# Patient Record
Sex: Female | Born: 1980 | Race: White | Hispanic: No | Marital: Married | Smoking: Never smoker
Health system: Southern US, Community
[De-identification: ages and names within clinical notes are randomized; demographics above are authoritative.]

## PROBLEM LIST (undated history)

## (undated) DIAGNOSIS — G47419 Narcolepsy without cataplexy: Secondary | ICD-10-CM

## (undated) DIAGNOSIS — F909 Attention-deficit hyperactivity disorder, unspecified type: Secondary | ICD-10-CM

## (undated) DIAGNOSIS — Q2112 Patent foramen ovale: Secondary | ICD-10-CM

## (undated) DIAGNOSIS — R519 Headache, unspecified: Secondary | ICD-10-CM

## (undated) DIAGNOSIS — R51 Headache: Secondary | ICD-10-CM

## (undated) DIAGNOSIS — Q211 Atrial septal defect: Secondary | ICD-10-CM

---

## 2014-04-11 LAB — US OB DETAIL + 14 WK

## 2014-04-12 ENCOUNTER — Other Ambulatory Visit (HOSPITAL_COMMUNITY): Payer: Self-pay | Admitting: *Deleted

## 2014-04-12 DIAGNOSIS — Q249 Congenital malformation of heart, unspecified: Secondary | ICD-10-CM

## 2014-04-12 DIAGNOSIS — IMO0002 Reserved for concepts with insufficient information to code with codable children: Secondary | ICD-10-CM

## 2014-04-12 DIAGNOSIS — Z0489 Encounter for examination and observation for other specified reasons: Secondary | ICD-10-CM

## 2014-04-21 ENCOUNTER — Encounter (HOSPITAL_COMMUNITY): Payer: Self-pay

## 2014-04-21 ENCOUNTER — Ambulatory Visit (HOSPITAL_COMMUNITY)
Admission: RE | Admit: 2014-04-21 | Discharge: 2014-04-21 | Disposition: A | Discharge status: Home / Self Care | Payer: BLUE CROSS/BLUE SHIELD | Source: Ambulatory Visit | Attending: *Deleted | Admitting: *Deleted

## 2014-04-21 DIAGNOSIS — O283 Abnormal ultrasonic finding on antenatal screening of mother: Secondary | ICD-10-CM

## 2014-04-21 DIAGNOSIS — Z315 Encounter for genetic counseling: Secondary | ICD-10-CM | POA: Diagnosis present

## 2014-04-21 DIAGNOSIS — Z3A20 20 weeks gestation of pregnancy: Secondary | ICD-10-CM | POA: Insufficient documentation

## 2014-04-21 DIAGNOSIS — Z8279 Family history of other congenital malformations, deformations and chromosomal abnormalities: Secondary | ICD-10-CM | POA: Insufficient documentation

## 2014-04-21 DIAGNOSIS — Q248 Other specified congenital malformations of heart: Secondary | ICD-10-CM | POA: Diagnosis not present

## 2014-04-21 DIAGNOSIS — Q330 Congenital cystic lung: Secondary | ICD-10-CM

## 2014-04-21 DIAGNOSIS — Z0489 Encounter for examination and observation for other specified reasons: Secondary | ICD-10-CM

## 2014-04-21 DIAGNOSIS — O358XX0 Maternal care for other (suspected) fetal abnormality and damage, not applicable or unspecified: Secondary | ICD-10-CM | POA: Insufficient documentation

## 2014-04-21 DIAGNOSIS — Q249 Congenital malformation of heart, unspecified: Secondary | ICD-10-CM

## 2014-04-21 DIAGNOSIS — Z3689 Encounter for other specified antenatal screening: Secondary | ICD-10-CM | POA: Insufficient documentation

## 2014-04-21 DIAGNOSIS — IMO0002 Reserved for concepts with insufficient information to code with codable children: Secondary | ICD-10-CM

## 2014-04-21 HISTORY — DX: Narcolepsy without cataplexy: G47.419

## 2014-04-21 HISTORY — DX: Headache: R51

## 2014-04-21 HISTORY — DX: Headache, unspecified: R51.9

## 2014-04-21 HISTORY — DX: Atrial septal defect: Q21.1

## 2014-04-21 HISTORY — DX: Patent foramen ovale: Q21.12

## 2014-04-21 HISTORY — DX: Attention-deficit hyperactivity disorder, unspecified type: F90.9

## 2014-04-21 NOTE — Consult Note (Signed)
Durwin NoraJeffrey M Jahmere Bramel, MD Physician Signed Maternal-Fetal Medicine Progress Notes 04/21/2014 3:13 PM    Expand All Collapse All   MFM Consultation:  CPAM is believed to result from focal arrest in fetal lung development before the seventh week of gestation secondary to a variety of pulmonary insults. Depending on the time and type of insult, 4-26% of cases can be associated with other congenital abnormalities. However, arrest of pulmonary development with distortion of architectural differentiation may take place at any stage of embryonic development. CAM differs from normal lung tissue because of a combination of increased cell proliferation and decreased apoptosis. A well-defined intrapulmonary bronchial system is lacking, and normally formed bronchi supplying the mass are absent.   CPAM is subdivided into 3 major types.   Type I lesions, the most common, are composed of 1 or more cysts measuring 2-10 cm in diameter. Larger cysts are often accompanied by smaller cysts, and their walls contain muscle, elastic, or fibrous tissue. Cysts are frequently lined by pseudostratified columnar epithelial cells, which occasionally produce mucin. Mucinogenic differentiation is unique to this subtype of CAM.   Type II lesions are characterized by small relatively uniform cysts resembling bronchioles. These cysts are lined by cuboid-to-columnar epithelium and have a thin fibromuscular wall. The cysts generally measure 0.5-2 cm in diameter.   Type III lesions consist of microscopic, adenomatoid cysts, and are grossly a solid mass without obvious cyst formation. Microscopic adenomatoid cysts are present.   CPAM receives its blood supply from the pulmonary circulation and is not sequestered from the tracheobronchial tree. However, type II and III lesions can occasionally coexist with extralobar sequestration, and in such cases, they may receive systemic arterial supply. CAM may also occur in combination with a  polyalveolar lobe. A polyalveolar lobe is a form of congenital emphysema with increased number of alveoli with normal bronchi and pulmonary vasculature. CAM usually occurs early in fetal life, whereas polyalveolar lobe occurs late.   Differential diagnosis: CPAM is differentiated from other congenital cystic disease by 5 characteristics including the following: (1) absence of bronchial cartilage (unless it is trapped within the lesion); (2) absence of bronchial tubular glands; (3) presence of tall columnar mucinous epithelium; (4) overproduction of terminal bronchiolar structures without alveolar differentiation, except in the subpleural areas; and (5) massive enlargement of the affected lobe that displaces other thoracic structures.  Mortality/Morbidity:  The prognosis primarily depends on the size of the lesion. In a [place] series of 48 patients, the incidence of postnatal demise was 10% (10 of 40 patients) with 8 spontaneous and voluntary abortions. Larger lesions have a higher incidence of mediastinal shift, vascular compromise, polyhydramnios, pulmonary hypoplasia, and hydrops, which may lead to intrauterine fetal demise or neonatal death.  Type III CAM tends to be extensive and therefore tends to have a poor prognosis. The prognosis is also poor with bilateral lung involvement, prematurity, and severe associated malformations. The most commonly associated anomalies occur in the type II form. The anomalies affect the renal (cystic disease, agenesis, dysgenesis), intestinal (atresias), cardiac, and osseous systems.  When CAM is identified in utero, as many as 56% of the lesions detected can regress spontaneously, although initially, they may progress. As the lesion decreases in size, mediastinal shift is corrected. Persistent lesions may only be discovered later in life, and some may be asymptomatic. Eventual removal of even asymptomatic masses is recommended because of potential risk of secondary  infection, hemorrhage, and reports of carcinomas arising in CAM.  In fetuses with life-threatening lesions such as  the development of hydrops, the anticipated mortality rate is nearly 100%.  Race: No clear racial predilection for CAM exists.  Sex: Sex-related incidences are equal for CAM.  Age: Most cases of CAM are diagnosed in the patient's first 6 months of life, with 70% of patients presenting in the first month of life. As many as 90% of cases are reported within the patient's first 2 years of life. Occasionally, CAM is discovered later in life, usually as a result of chronic or recurrent pulmonary infection. When CAM was diagnosed prenatally in one study, the mean gestational age at diagnosis was 22.6 weeks +/- 3.3.   Anatomy:  Communication with the tracheobronchial tree usually is retained, and the vascular supply and venous drainage are to the pulmonary circulation, unless CAM is associated with sequestration, as discussed above. Lesions occur with equal frequency in either lung, but the lesions have a slight predilection for the upper lobes. Lobar involvement is seen most often, but multiple lobes, the entire lung, or segments of both lungs may be involved.  Complications:  Fetal death caused by hydrops, fetal surgery, prematurity, or associated malformations. Premature delivery due to polyhydramnios. Respiratory distress due to hydrops, pulmonary hypoplasia, pulmonary hypertension, pneumothorax, or prematurity. Postnatal death due to respiratory distress, untreated hydrops, or pulmonary hypertension. Recurrent pneumonia, pneumothorax, and hemothorax Malignant change: Rhabdomyosarcoma, pulmonary blastomas, minute squamous cell carcinoma, and bronchioloalveolar carcinoma have all been described in association with CCAM. Prognosis: The risk of mortality in fetuses with hydrops is as high as 80-90%. Other indicators of poor prognosis include the type of lesion, with microcystic CCAM  associated with much poorer outcomes. The overall size of the lesion has also been reported as being an important predictor of survival; however, this index may be compromised by the fact that CCAM may undergo involution and even disappear in utero. Some authorities have suggested that the presence of bilateral lesions is associated with a worse outcome. More controversially, left-sided lesions may be associated with a greater mortality rate than right-sided lesions. One study suggested that polyhydramnios is also associated with a poorer outcome.  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 see genetic counseling (separate) 2. interval growth ultrasounds monthly (scheduled) 3. q2 week limited scans for hydrops (scheduled) 4. see AS OBGYN (separate) 5. fetal echo scheduled 6. consultation with pediatric CT surgery around 28 weeks 7. delivery 39-40 weeks at either [facility] or [facility].  Time Spent: I spent in excess of 60 minutes in consultation with this patient to review records, evaluate her case, and provide her with an adequate discussion and education. More than 50% of this time was spent in direct face-to-face counseling. It was a pleasure seeing your patient in the office today. Thank you for consultation. Please do not hesitate to contact our service for any further questions.   Thank you,  Louann Sjogren Gaynelle Arabian, Louann Sjogren, MD, MS, FACOG Assistant Professor Section of Maternal-Fetal Medicine [facility]       Routing History     Date/Time From To Method   04/21/2014 3:44 PM Durwin Nora, MD Belva Crome, MD Fax

## 2014-04-21 NOTE — Progress Notes (Signed)
Genetic Counseling  High-Risk Gestation Note  Appointment Date:  04/21/2014 Referred By: Myles Gip, MD Date of Birth:  Jun 19, 1980 Partner:  Izell Branson   Pregnancy History: [id number] Estimated Date of Delivery: 09/05/14 Estimated Gestational Age: 8w3dAttending: JViann Fish MD   I met with Mrs. Kayleanna Min and her husband, Mr. BChar Feltman for genetic counseling because of the patient's history of congenital heart disease and the ultrasound finding of CPAM.  We began by reviewing the ultrasound in detail. 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 Complete ultrasound results reported separately.   Congenital pulmonary airway malformation (CPAM) is a developmental anomaly of the lower respiratory tract. We reviewed that the prognosis can depend upon the size of the lesion and the development of hydrops. In the absence of hydrops, we discussed that the prognosis is overall very good. We discussed that CPAM typically occurs sporadically and is not expected to be associated with an increased risk for underlying fetal aneuploidy or genetic conditions. Please see separate MFM consultation note for more detailed discussion of CPAM with the patient. Follow-up ultrasounds were scheduled for 05/04/14 and 05/17/14.   Both family histories were reviewed and found to be contributory for history of patent foramen ovale (PFO) for the patient. She reported that this was diagnosed at age [age]years old when she was having symptoms that were later determined to be caused by anxiety. She has not required surgical treatment for the PFO and does not see cardiology regularly. Mrs. Basara reported that her last echocardiogram was approximately 89years age. She reported that her cardiologist at that time had recommended she be seen for follow-up in approximately 4-5 years. Congenital heart defects occur in approximately 1% of pregnancies.  Congenital  heart defects may occur due to multifactorial influences, chromosomal abnormalities, genetic syndromes or environmental exposures.  Isolated heart defects are generally multifactorial.  Given the reported family history and assuming multifactorial inheritance, the risk for a congenital heart defect in the current pregnancy would be approximately 5-6%. We discussed the option of fetal echocardiogram in the pregnancy to assess fetal heart structure in more detail. [facility] The patient understands that ultrasound in pregnancy cannot diagnose or rule out all birth defects and specifically would not be able to rule out a PFO.   The father of the pregnancy reported a maternal aunt who had a son who was stillborn. No underlying cause was known by the couple at the time of today's visit for the stillbirth. This same aunt reportedly also has three healthy children. We discussed that there can be many causes for stillbirth and that in some cases, an underlying cause is not determined. We discussed that this reported family history is unlikely to have implications for relatives. However, without further information regarding the provided family history, an accurate genetic risk cannot be calculated. Further genetic counseling is warranted if more information is obtained.  Mrs. Mcafee was provided with written information regarding cystic fibrosis (CF) including the carrier frequency and incidence in the Caucasian population, the availability of carrier testing and prenatal diagnosis if indicated.  In addition, we discussed that CF is routinely screened for as part of the [place] newborn screening panel.  CF carrier screening was performed during her first pregnancy and was reportedly normal for the mutations screened. Thus, her risk to be a CF carrier has been reduced but not eliminated.    Mrs. Axtman denied exposure to environmental toxins or  chemical  agents. She denied the use of alcohol, tobacco or street drugs. She denied significant viral illnesses during the course of her pregnancy. Her medical and surgical histories were additionally contributory for taking ritalin in the pregnancy. She reported previously taking ibuprofen but discontinued at approximately 15-[redacted] weeks gestation.  Dr. Margurite Auerbach reviewed the history of Mrs. Steinhoff's patent ductus arteriosus and recommended that Mrs. Hooley schedule a maternal echocardiogram during the pregnancy given this history and given that her most recent echocardiogram was six year ago. Mrs. Malburg can follow-up with her cardiologist locally for this exam, if preferred.   I counseled this couple regarding the above risks and available options.  The approximate face-to-face time with the genetic counselor was 35 minutes.  Chipper Oman, MS Certified Genetic Counselor 04/21/2014

## 2014-04-21 NOTE — Progress Notes (Signed)
MFM Consultation:  CPAM is believed to result from focal arrest in fetal lung development before the seventh week of gestation secondary to a variety of pulmonary insults. Depending on the time and type of insult, 4-26% of cases can be associated with other congenital abnormalities. However, arrest of pulmonary development with distortion of architectural differentiation may take place at any stage of embryonic development.  CAM differs from normal lung tissue because of a combination of increased cell proliferation and decreased apoptosis. A well-defined intrapulmonary bronchial system is lacking, and normally formed bronchi supplying the mass are absent.   CPAM is subdivided into 3 major types.   Type I lesions, the most common, are composed of 1 or more cysts measuring 2-10 cm in diameter. Larger cysts are often accompanied by smaller cysts, and their walls contain muscle, elastic, or fibrous tissue. Cysts are frequently lined by pseudostratified columnar epithelial cells, which occasionally produce mucin. Mucinogenic differentiation is unique to this subtype of CAM.   Type II lesions are characterized by small relatively uniform cysts resembling bronchioles. These cysts are lined by cuboid-to-columnar epithelium and have a thin fibromuscular wall. The cysts generally measure 0.5-2 cm in diameter.   Type III lesions consist of microscopic, adenomatoid cysts, and are grossly a solid mass without obvious cyst formation. Microscopic adenomatoid cysts are present.   CPAM receives its blood supply from the pulmonary circulation and is not sequestered from the tracheobronchial tree. However, type II and III lesions can occasionally coexist with extralobar sequestration, and in such cases, they may receive systemic arterial supply. CAM may also occur in combination with a polyalveolar lobe. A polyalveolar lobe is a form of congenital emphysema with increased number of alveoli with normal bronchi and  pulmonary vasculature. CAM usually occurs early in fetal life, whereas polyalveolar lobe occurs late.   Differential diagnosis: CPAM is differentiated from other congenital cystic disease by 5 characteristics including the following: (1) absence of bronchial cartilage (unless it is trapped within the lesion); (2) absence of bronchial tubular glands; (3) presence of tall columnar mucinous epithelium; (4) overproduction of terminal bronchiolar structures without alveolar differentiation, except in the subpleural areas; and (5) massive enlargement of the affected lobe that displaces other thoracic structures.  Mortality/Morbidity:  The prognosis primarily depends on the size of the lesion. In a [place] series of 48 patients, the incidence of postnatal demise was 10% (10 of 40 patients) with 8 spontaneous and voluntary abortions. Larger lesions have a higher incidence of mediastinal shift, vascular compromise, polyhydramnios, pulmonary hypoplasia, and hydrops, which may lead to intrauterine fetal demise or neonatal death.  Type III CAM tends to be extensive and therefore tends to have a poor prognosis. The prognosis is also poor with bilateral lung involvement, prematurity, and severe associated malformations. The most commonly associated anomalies occur in the type II form. The anomalies affect the renal (cystic disease, agenesis, dysgenesis), intestinal (atresias), cardiac, and osseous systems.  When CAM is identified in utero, as many as 56% of the lesions detected can regress spontaneously, although initially, they may progress.  As the lesion decreases in size, mediastinal shift is corrected. Persistent lesions may only be discovered later in life, and some may be asymptomatic.  Eventual removal of even asymptomatic masses is recommended because of potential risk of secondary infection, hemorrhage, and reports of carcinomas arising in CAM.  In fetuses with life-threatening lesions such as the development  of hydrops, the anticipated mortality rate is nearly 100%.  Race: No clear racial predilection for CAM  exists.  Sex: Sex-related incidences are equal for CAM.  Age: Most cases of CAM are diagnosed in the patient's first 6 months of life, with 70% of patients presenting in the first month of life. As many as 90% of cases are reported within the patient's first 2 years of life. Occasionally, CAM is discovered later in life, usually as a result of chronic or recurrent pulmonary infection. When CAM was diagnosed prenatally in one study, the mean gestational age at diagnosis was 22.6 weeks +/- 3.3.   Anatomy:  Communication with the tracheobronchial tree usually is retained, and the vascular supply and venous drainage are to the pulmonary circulation, unless CAM is associated with sequestration, as discussed above. Lesions occur with equal frequency in either lung, but the lesions have a slight predilection for the upper lobes. Lobar involvement is seen most often, but multiple lobes, the entire lung, or segments of both lungs may be involved.  Complications:  Fetal death caused by hydrops, fetal surgery, prematurity, or associated malformations.  Premature delivery due to polyhydramnios.  Respiratory distress due to hydrops, pulmonary hypoplasia, pulmonary hypertension, pneumothorax, or prematurity.  Postnatal death due to respiratory distress, untreated hydrops, or pulmonary hypertension.  Recurrent pneumonia, pneumothorax, and hemothorax Malignant change: Rhabdomyosarcoma, pulmonary blastomas, minute squamous cell carcinoma, and bronchioloalveolar carcinoma have all been described in association with CCAM. Prognosis: The risk of mortality in fetuses with hydrops is as high as 80-90%. Other indicators of poor prognosis include the type of lesion, with microcystic CCAM associated with much poorer outcomes.  The overall size of the lesion has also been reported as being an important predictor of survival;  however, this index may be compromised by the fact that CCAM may undergo involution and even disappear in utero.  Some authorities have suggested that the presence of bilateral lesions is associated with a worse outcome.  More controversially, left-sided lesions may be associated with a greater mortality rate than right-sided lesions.  One study suggested that polyhydramnios is also associated with a poorer outcome.  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 see genetic counseling (separate) 2. interval growth ultrasounds monthly (scheduled) 3. q2 week limited scans for hydrops (scheduled) 4. see AS OBGYN (separate) 5. fetal echo scheduled 6. consultation with pediatric CT surgery around 28 weeks 7. delivery 39-40 weeks at either [facility] or [facility].  Time Spent: I spent in excess of 60 minutes in consultation with this patient to review records, evaluate her case, and provide her with an adequate discussion and education.  More than 50% of this time was spent in direct face-to-face counseling. It was a pleasure seeing your patient in the office today.  Thank you for consultation. Please do not hesitate to contact our service for any further questions.   Thank you,  Louann Sjogren Gaynelle Arabian, Louann Sjogren, MD, MS, FACOG Assistant Professor Section of Maternal-Fetal Medicine [facility]

## 2014-04-24 ENCOUNTER — Encounter (HOSPITAL_COMMUNITY): Payer: Self-pay | Admitting: *Deleted

## 2014-04-24 ENCOUNTER — Other Ambulatory Visit (HOSPITAL_COMMUNITY): Payer: Self-pay | Admitting: Obstetrics and Gynecology

## 2014-04-24 ENCOUNTER — Other Ambulatory Visit (HOSPITAL_COMMUNITY): Payer: Self-pay | Admitting: *Deleted

## 2014-04-24 DIAGNOSIS — Z3A22 22 weeks gestation of pregnancy: Secondary | ICD-10-CM

## 2014-04-24 DIAGNOSIS — O359XX9 Maternal care for (suspected) fetal abnormality and damage, unspecified, other fetus: Secondary | ICD-10-CM

## 2014-04-24 DIAGNOSIS — Z3A24 24 weeks gestation of pregnancy: Secondary | ICD-10-CM

## 2014-05-04 ENCOUNTER — Ambulatory Visit (HOSPITAL_COMMUNITY)
Admission: RE | Admit: 2014-05-04 | Discharge: 2014-05-04 | Disposition: A | Discharge status: Home / Self Care | Payer: BLUE CROSS/BLUE SHIELD | Source: Ambulatory Visit | Attending: *Deleted | Admitting: *Deleted

## 2014-05-04 ENCOUNTER — Other Ambulatory Visit (HOSPITAL_COMMUNITY): Payer: Self-pay | Admitting: Obstetrics and Gynecology

## 2014-05-04 ENCOUNTER — Encounter (HOSPITAL_COMMUNITY): Payer: Self-pay

## 2014-05-04 DIAGNOSIS — O358XX0 Maternal care for other (suspected) fetal abnormality and damage, not applicable or unspecified: Secondary | ICD-10-CM | POA: Insufficient documentation

## 2014-05-04 DIAGNOSIS — O359XX0 Maternal care for (suspected) fetal abnormality and damage, unspecified, not applicable or unspecified: Secondary | ICD-10-CM | POA: Insufficient documentation

## 2014-05-04 DIAGNOSIS — O359XX9 Maternal care for (suspected) fetal abnormality and damage, unspecified, other fetus: Secondary | ICD-10-CM

## 2014-05-04 DIAGNOSIS — Z3A22 22 weeks gestation of pregnancy: Secondary | ICD-10-CM | POA: Diagnosis not present

## 2014-05-17 ENCOUNTER — Ambulatory Visit (HOSPITAL_COMMUNITY): Payer: BLUE CROSS/BLUE SHIELD

## 2014-05-23 ENCOUNTER — Ambulatory Visit (HOSPITAL_COMMUNITY)
Admission: RE | Admit: 2014-05-23 | Discharge: 2014-05-23 | Disposition: A | Discharge status: Home / Self Care | Payer: BLUE CROSS/BLUE SHIELD | Source: Ambulatory Visit | Attending: *Deleted | Admitting: *Deleted

## 2014-05-23 ENCOUNTER — Encounter (HOSPITAL_COMMUNITY): Payer: Self-pay

## 2014-05-23 ENCOUNTER — Encounter (HOSPITAL_COMMUNITY): Payer: Self-pay | Admitting: *Deleted

## 2014-05-23 VITALS — BP 105/68 | HR 63 | Wt 154.2 lb

## 2014-05-23 DIAGNOSIS — Z3A24 24 weeks gestation of pregnancy: Secondary | ICD-10-CM

## 2014-05-23 DIAGNOSIS — O35CXX0 Maternal care for other (suspected) fetal abnormality and damage, fetal pulmonary anomalies, not applicable or unspecified: Secondary | ICD-10-CM | POA: Insufficient documentation

## 2014-05-23 DIAGNOSIS — O359XX9 Maternal care for (suspected) fetal abnormality and damage, unspecified, other fetus: Secondary | ICD-10-CM

## 2014-05-23 DIAGNOSIS — Z3A25 25 weeks gestation of pregnancy: Secondary | ICD-10-CM | POA: Diagnosis not present

## 2014-05-23 DIAGNOSIS — O358XX0 Maternal care for other (suspected) fetal abnormality and damage, not applicable or unspecified: Secondary | ICD-10-CM | POA: Insufficient documentation

## 2014-05-23 DIAGNOSIS — Z8279 Family history of other congenital malformations, deformations and chromosomal abnormalities: Secondary | ICD-10-CM

## 2014-05-25 ENCOUNTER — Other Ambulatory Visit (HOSPITAL_COMMUNITY): Payer: Self-pay | Admitting: Maternal and Fetal Medicine

## 2014-05-25 DIAGNOSIS — O359XX0 Maternal care for (suspected) fetal abnormality and damage, unspecified, not applicable or unspecified: Secondary | ICD-10-CM

## 2014-06-09 ENCOUNTER — Encounter (HOSPITAL_COMMUNITY): Payer: Self-pay

## 2014-06-09 ENCOUNTER — Ambulatory Visit (HOSPITAL_COMMUNITY)
Admission: RE | Admit: 2014-06-09 | Discharge: 2014-06-09 | Disposition: A | Discharge status: Home / Self Care | Payer: BLUE CROSS/BLUE SHIELD | Source: Ambulatory Visit | Attending: *Deleted | Admitting: *Deleted

## 2014-06-09 VITALS — BP 106/64 | HR 96 | Wt 155.8 lb

## 2014-06-09 DIAGNOSIS — Z8279 Family history of other congenital malformations, deformations and chromosomal abnormalities: Secondary | ICD-10-CM

## 2014-06-09 DIAGNOSIS — O358XX0 Maternal care for other (suspected) fetal abnormality and damage, not applicable or unspecified: Secondary | ICD-10-CM | POA: Diagnosis present

## 2014-06-09 DIAGNOSIS — Z3A27 27 weeks gestation of pregnancy: Secondary | ICD-10-CM | POA: Diagnosis not present

## 2014-06-09 DIAGNOSIS — O359XX0 Maternal care for (suspected) fetal abnormality and damage, unspecified, not applicable or unspecified: Secondary | ICD-10-CM

## 2014-06-23 ENCOUNTER — Ambulatory Visit (HOSPITAL_COMMUNITY): Payer: BLUE CROSS/BLUE SHIELD

## 2014-06-29 ENCOUNTER — Other Ambulatory Visit (HOSPITAL_COMMUNITY): Payer: Self-pay | Admitting: *Deleted

## 2014-06-29 ENCOUNTER — Other Ambulatory Visit (HOSPITAL_COMMUNITY): Payer: Self-pay | Admitting: Obstetrics and Gynecology

## 2014-06-29 DIAGNOSIS — O359XX0 Maternal care for (suspected) fetal abnormality and damage, unspecified, not applicable or unspecified: Secondary | ICD-10-CM

## 2014-07-12 ENCOUNTER — Ambulatory Visit (HOSPITAL_COMMUNITY): Admission: RE | Admit: 2014-07-12 | Payer: BLUE CROSS/BLUE SHIELD | Source: Ambulatory Visit

## 2014-07-19 ENCOUNTER — Encounter (HOSPITAL_COMMUNITY): Payer: Self-pay

## 2014-07-19 ENCOUNTER — Other Ambulatory Visit (HOSPITAL_COMMUNITY): Payer: Self-pay | Admitting: Maternal and Fetal Medicine

## 2014-07-19 ENCOUNTER — Ambulatory Visit (HOSPITAL_COMMUNITY)
Admission: RE | Admit: 2014-07-19 | Discharge: 2014-07-19 | Disposition: A | Discharge status: Home / Self Care | Payer: BLUE CROSS/BLUE SHIELD | Source: Ambulatory Visit | Attending: *Deleted | Admitting: *Deleted

## 2014-07-19 ENCOUNTER — Other Ambulatory Visit (HOSPITAL_COMMUNITY): Payer: Self-pay | Admitting: Obstetrics and Gynecology

## 2014-07-19 DIAGNOSIS — Z3A33 33 weeks gestation of pregnancy: Secondary | ICD-10-CM | POA: Diagnosis not present

## 2014-07-19 DIAGNOSIS — O359XX0 Maternal care for (suspected) fetal abnormality and damage, unspecified, not applicable or unspecified: Secondary | ICD-10-CM

## 2014-07-19 DIAGNOSIS — O358XX0 Maternal care for other (suspected) fetal abnormality and damage, not applicable or unspecified: Secondary | ICD-10-CM | POA: Diagnosis not present

## 2014-07-20 ENCOUNTER — Encounter (HOSPITAL_COMMUNITY): Payer: Self-pay | Admitting: *Deleted

## 2014-08-15 ENCOUNTER — Encounter (HOSPITAL_COMMUNITY): Payer: Self-pay

## 2014-08-15 ENCOUNTER — Ambulatory Visit (HOSPITAL_COMMUNITY)
Admission: RE | Admit: 2014-08-15 | Discharge: 2014-08-15 | Disposition: A | Discharge status: Home / Self Care | Payer: BLUE CROSS/BLUE SHIELD | Source: Ambulatory Visit | Attending: *Deleted | Admitting: *Deleted

## 2014-08-15 ENCOUNTER — Other Ambulatory Visit (HOSPITAL_COMMUNITY): Payer: Self-pay | Admitting: Maternal and Fetal Medicine

## 2014-08-15 DIAGNOSIS — O359XX0 Maternal care for (suspected) fetal abnormality and damage, unspecified, not applicable or unspecified: Secondary | ICD-10-CM | POA: Insufficient documentation

## 2014-08-15 DIAGNOSIS — Z3A37 37 weeks gestation of pregnancy: Secondary | ICD-10-CM | POA: Insufficient documentation

## 2015-02-24 ENCOUNTER — Encounter (HOSPITAL_COMMUNITY): Payer: Self-pay | Admitting: *Deleted

## 2015-10-05 IMAGING — US US OB LIMITED
1 series · 13 of 25 positions shown · non-contrast
Comparison: none

[Series 1: us ob limited · 0.23mm/px · 25 acquisitions, 13 frames shown]
[im 1/25]
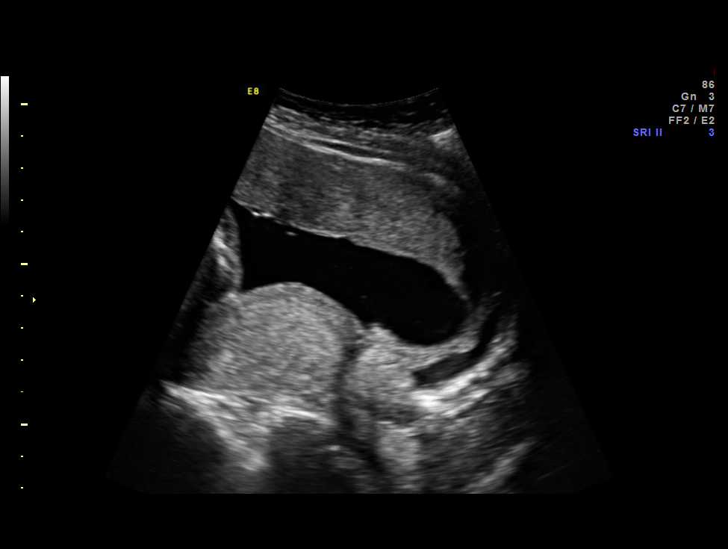
[im 3/25]
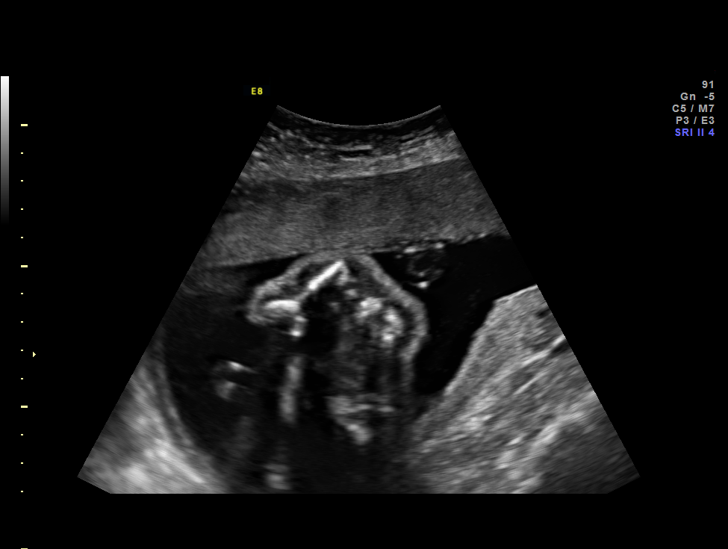
[im 5/25]
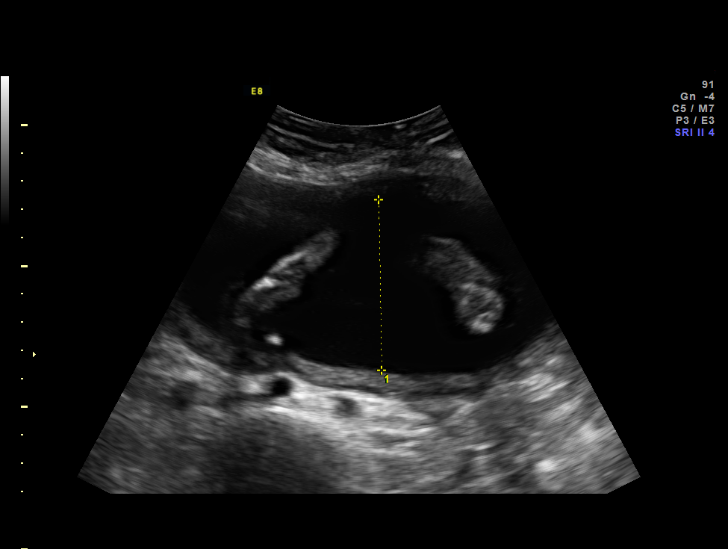
[im 7/25]
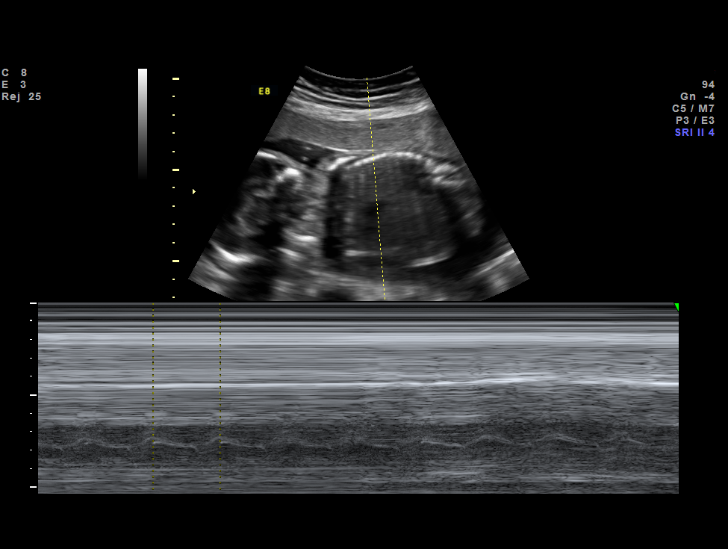
[im 9/25]
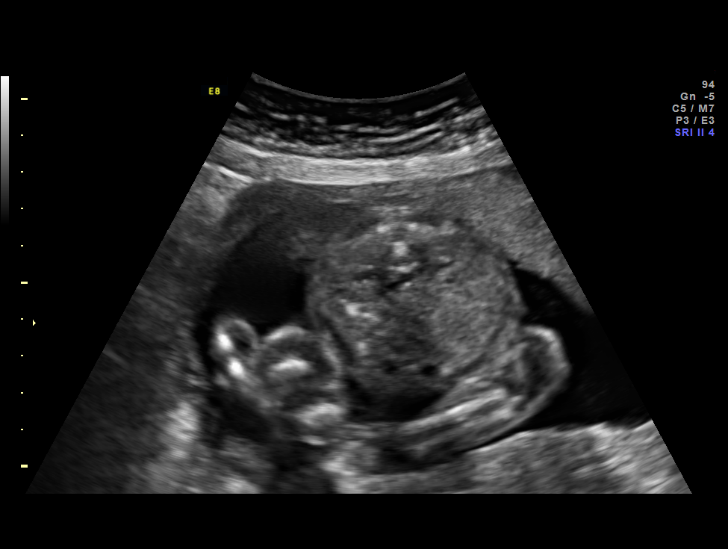
[im 11/25]
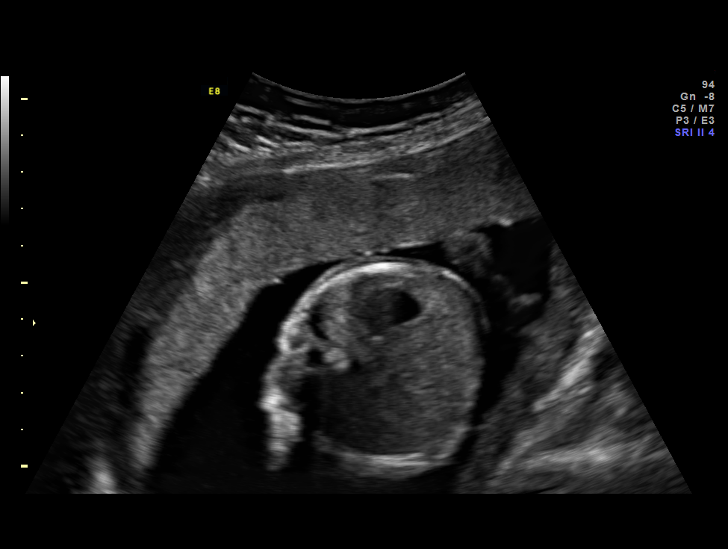
[im 13/25]
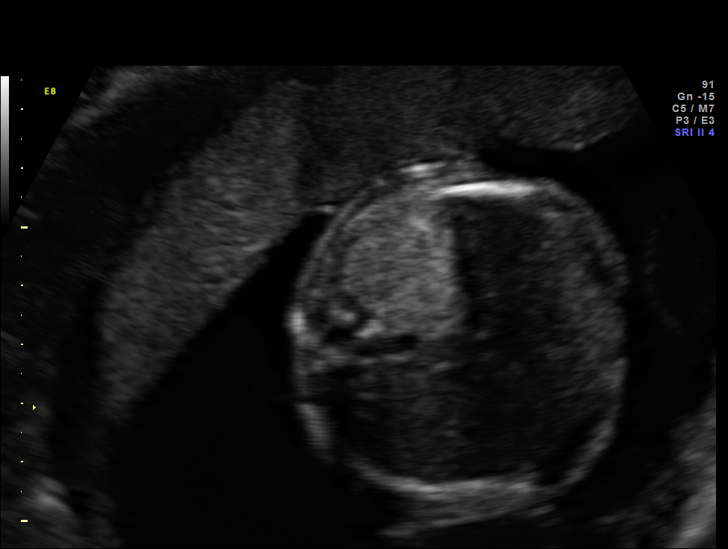
[im 15/25]
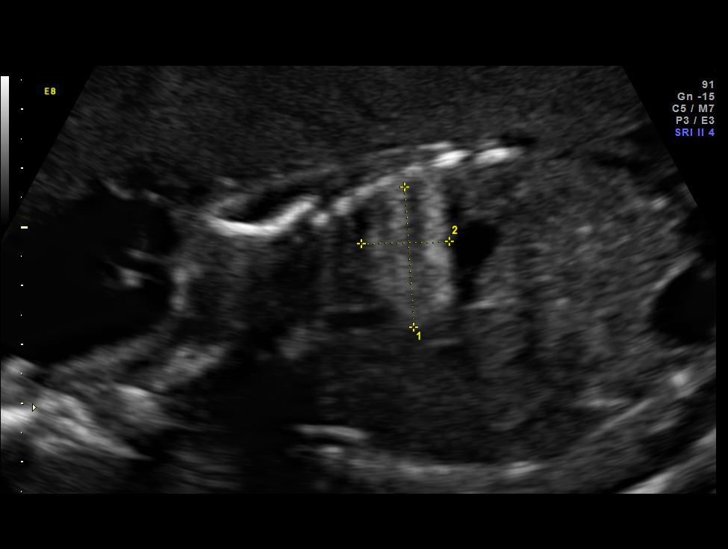
[im 17/25]
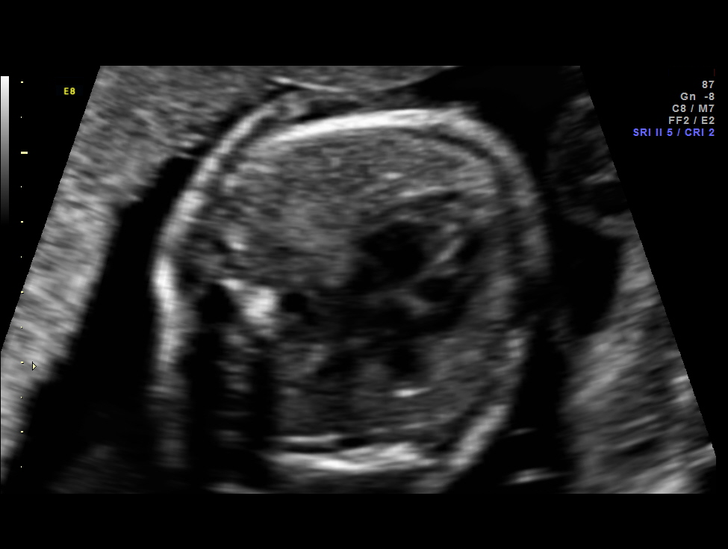
[im 19/25]
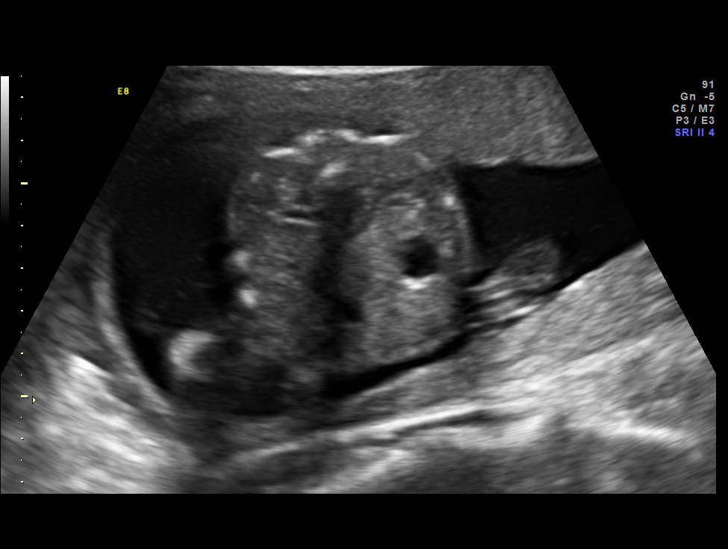
[im 21/25]
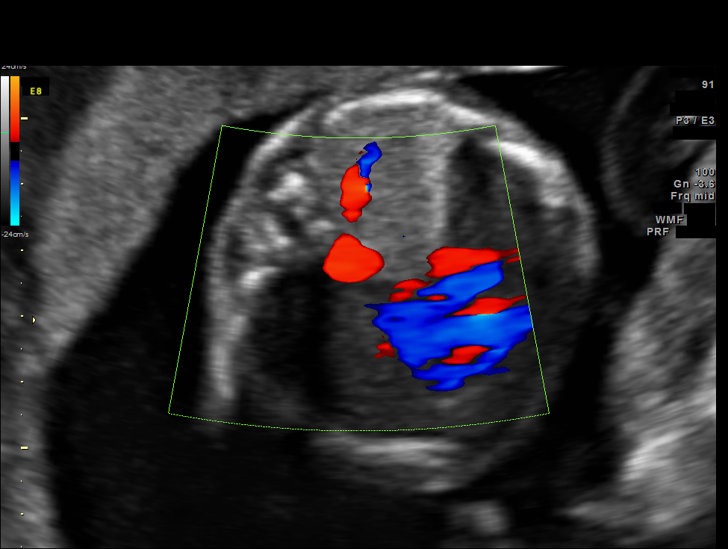
[im 23/25]
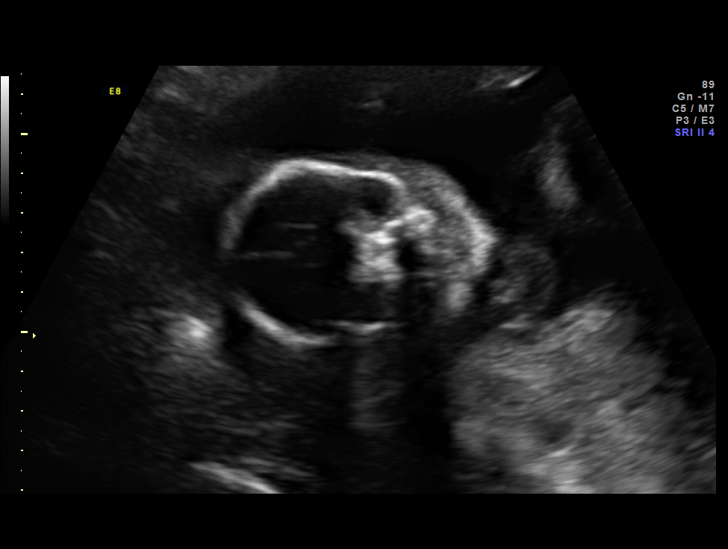
[im 25/25]
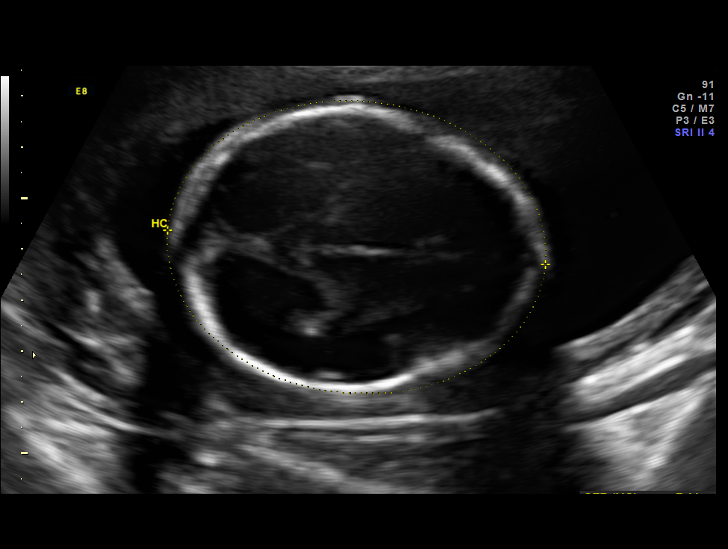

[13 of 25 positions shown; findings below may reference images not displayed]

OBSTETRICS REPORT
                    (Corrected Final 05/09/2014 [DATE])

Service(s) Provided

 [HOSPITAL]                                         76815.0
Indications

 Fetal abnormality - other known or suspected
 (chest mass)
 22 weeks gestation of pregnancy
Fetal Evaluation

 Num Of Fetuses:    1
 Fetal Heart Rate:  150                          bpm
 Cardiac Activity:  Observed
 Presentation:      Cephalic
 Placenta:          Anterior, above cervical os
 P. Cord            Previously Visualized
 Insertion:

 Comment:    Chest mass 2.4 x 1.9 x 1.5 cm HC 20.79cm   CVR

 Amniotic Fluid
 AFI FV:      Subjectively within normal limits
                                             Larg Pckt:     6.1  cm
Gestational Age

 LMP:           22w 2d        Date:  11/29/13                 EDD:   09/05/14
 Best:          22w 2d     Det. By:  LMP  (11/29/13)          EDD:   09/05/14
Cervix Uterus Adnexa

 Cervix:       Normal appearance by transabdominal scan. Appears
               closed, without funnelling.
Impression

 Single IUP at 22w 2d
 Limited ultrasound performed
 A [DATE] x 1.9 x 1.5 cm echogenic / solid left lung mass is noted
 consistent with type 3 CPAM
 CVR 0.17 (low risk for fetal hydrops)
 No appreciable mediastinal shift is noted
 No evidence of hydrops
 Normal amniotic fluid volume
Recommendations

 Recommend follow up in 2 weeks for growth / hydrops check.
 Would offer course of [place] at that time (may
 decrease size of CPAM lesion)
 Fetal echo scheduled
 Plan Peds surgery consultation at 
 28 weeks

                 Attending Physician, JOSE TRIPLETA

## 2015-10-24 IMAGING — US US OB FOLLOW-UP
1 series · 12 of 28 positions shown · non-contrast
Comparison: none

[Series 1: us ob follow-up · 0.21mm/px · 63 acquisitions, 12 frames shown]
[im 3/63]
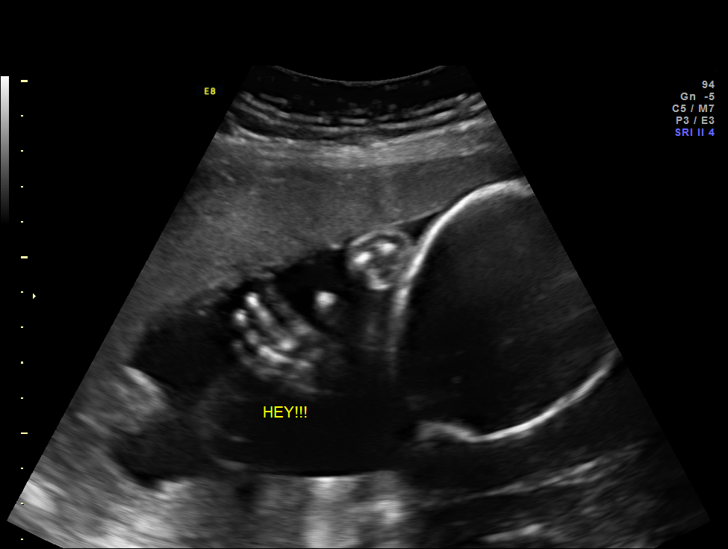
[im 7/63]
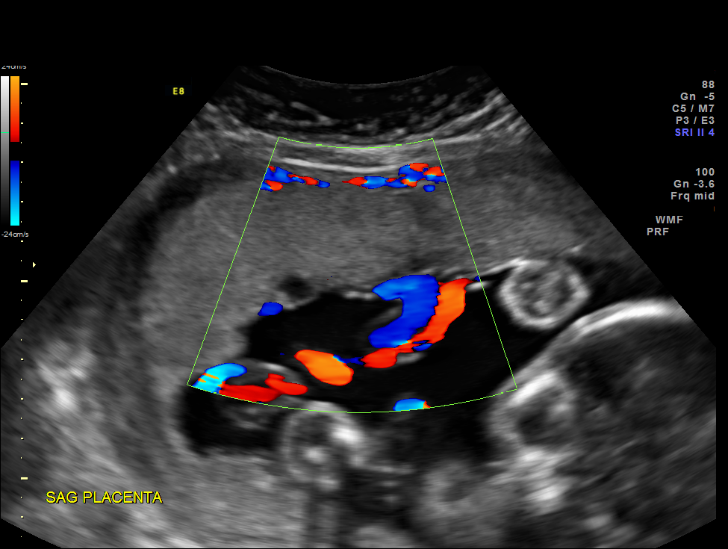
[im 12/63]
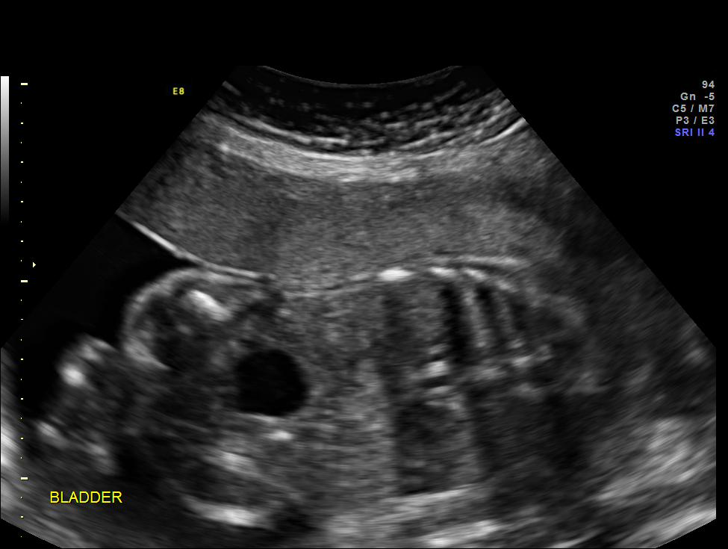
[im 19/63]
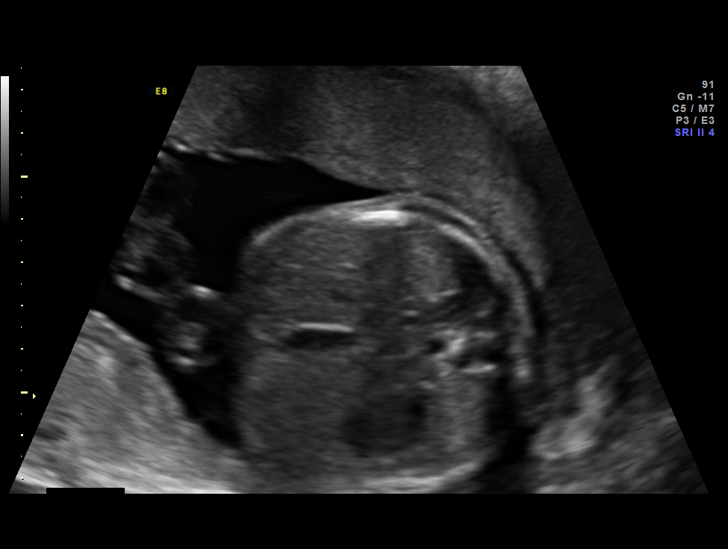
[im 23/63]
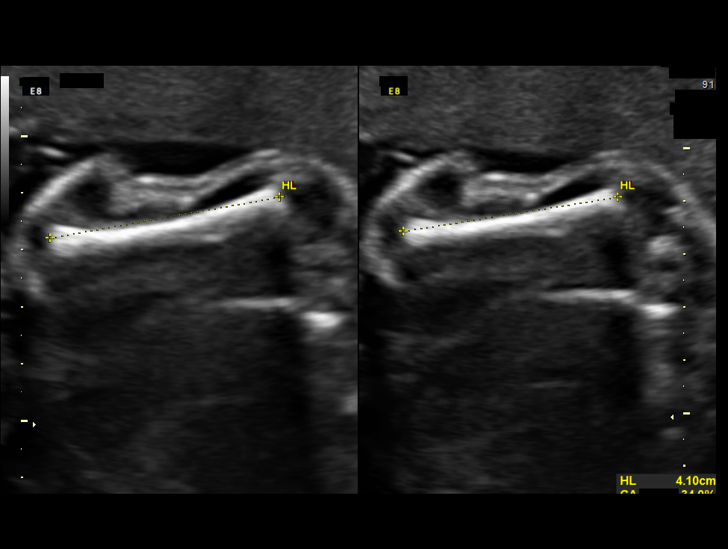
[im 28/63]
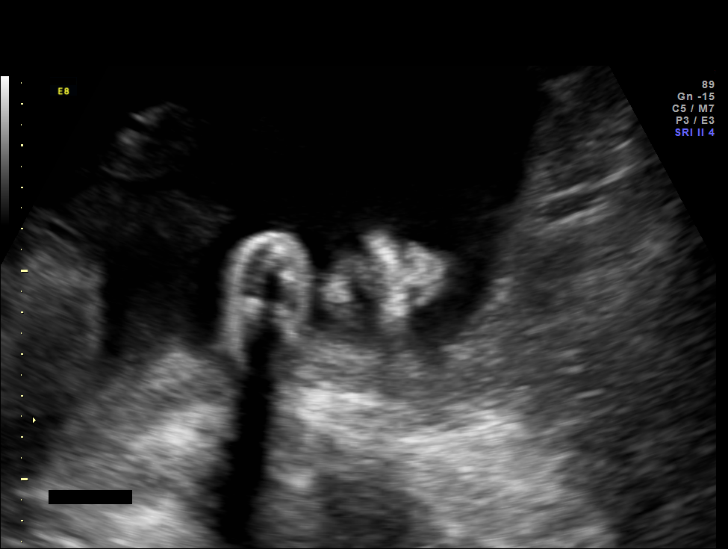
[im 35/63]
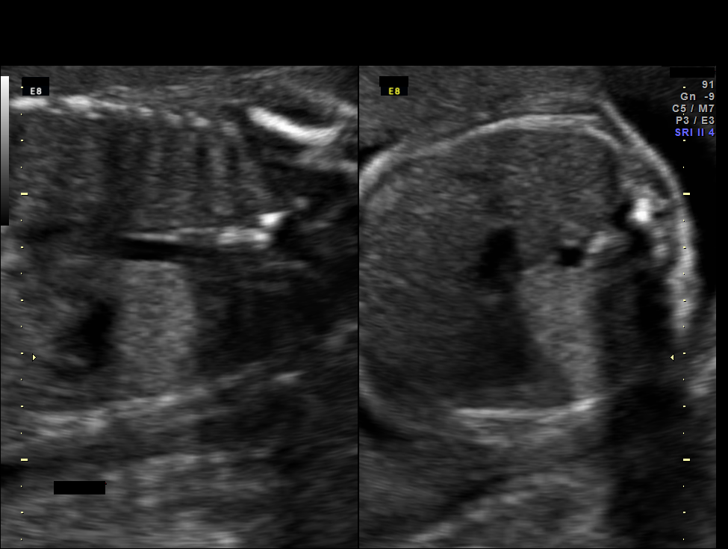
[im 40/63]
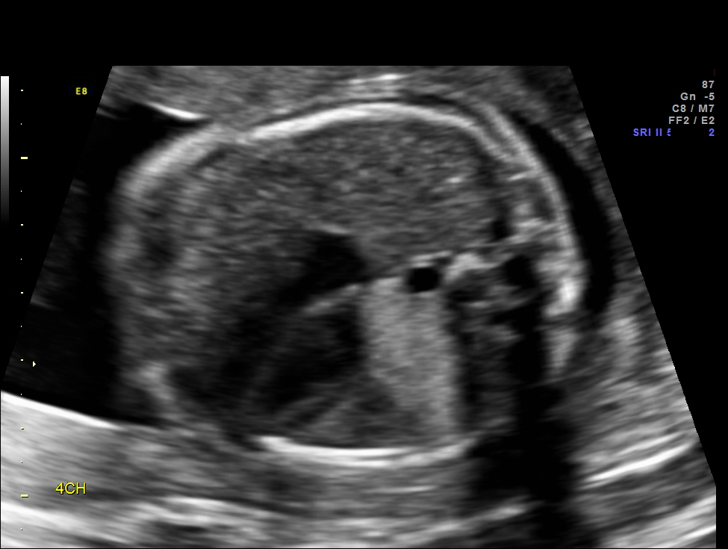
[im 44/63]
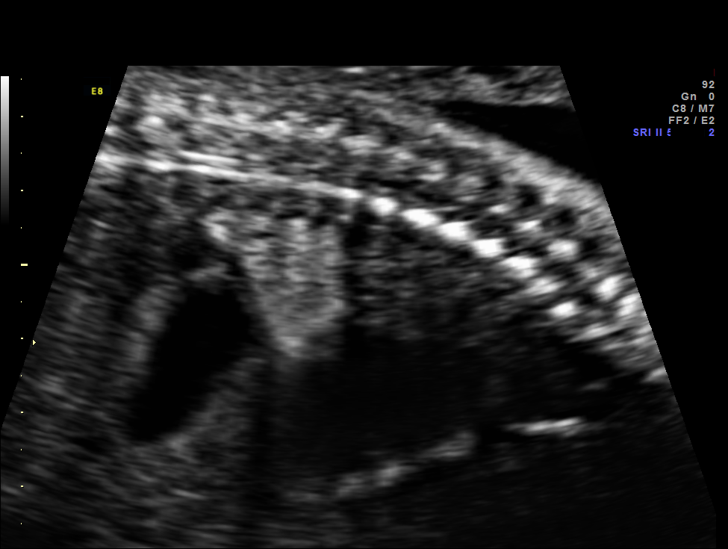
[im 51/63]
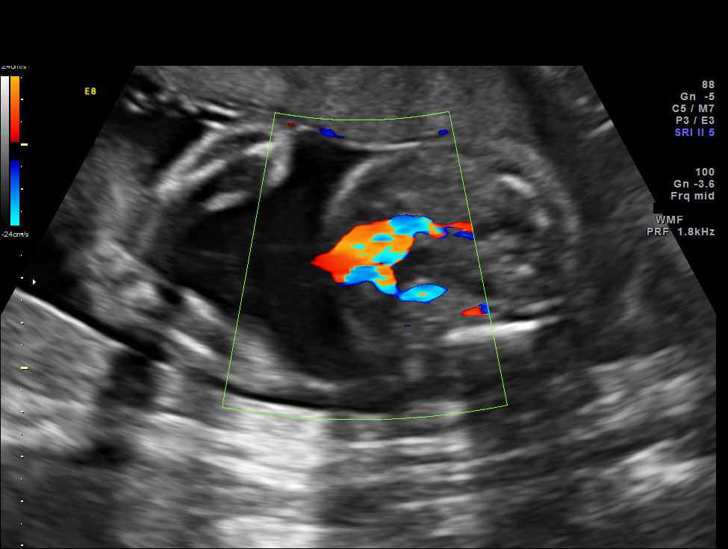
[im 56/63]
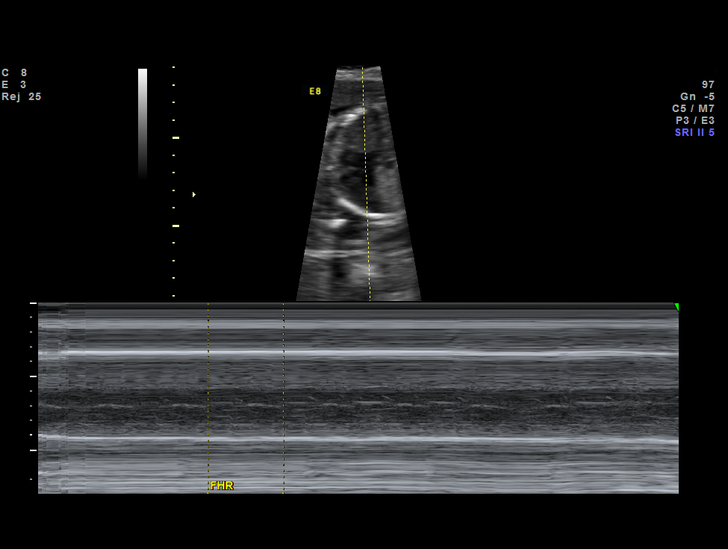
[im 60/63]
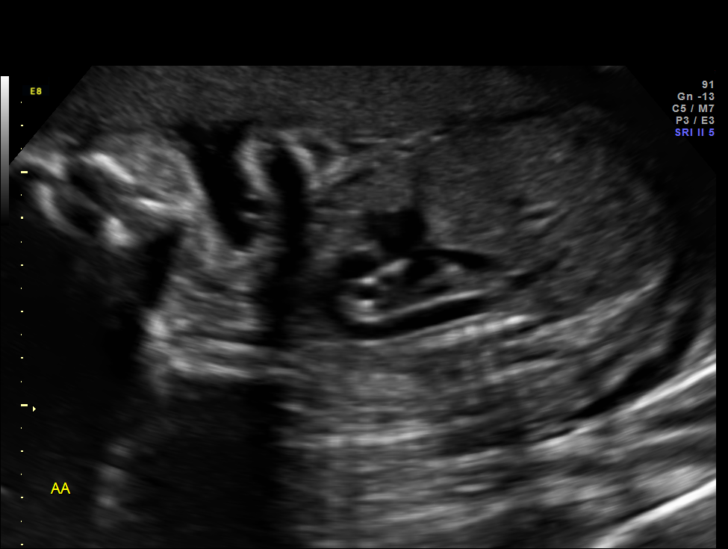

[12 of 28 positions shown; findings below may reference images not displayed]

OBSTETRICS REPORT
                      (Signed Final 05/23/2014 [DATE])

Service(s) Provided

 US OB FOLLOW UP                                       76816.1
Indications

 Fetal abnormality - other known or suspected; type
 3 CPAM on left
 25 weeks gestation of pregnancy
Fetal Evaluation

 Num Of Fetuses:    1
 Fetal Heart Rate:  134                          bpm
 Cardiac Activity:  Observed
 Presentation:      Variable
 Placenta:          Anterior, above cervical os
 P. Cord            Visualized, central
 Insertion:

 Amniotic Fluid
 AFI FV:      Subjectively within normal limits
                                             Larg Pckt:     6.5  cm
Biometry

 BPD:     63.5  mm     G. Age:  25w 5d                CI:         77.6   70 - 86
 OFD:     81.8  mm                                    FL/HC:      19.5   18.7 -

 HC:     233.1  mm     G. Age:  25w 2d       42  %    HC/AC:      1.03   1.04 -

 AC:       226  mm     G. Age:  27w 0d       91  %    FL/BPD:     71.5   71 - 87
 FL:      45.4  mm     G. Age:  25w 0d       38  %    FL/AC:      20.1   20 - 24
 HUM:       41  mm     G. Age:  24w 6d       39  %

 Est. FW:     888  gm    1 lb 15 oz      75  %
Gestational Age

 LMP:           25w 0d        Date:  11/29/13                 EDD:   09/05/14
 U/S Today:     25w 5d                                        EDD:   08/31/14
 Best:          25w 0d     Det. By:  LMP  (11/29/13)          EDD:   09/05/14
Anatomy

 Cranium:          Appears normal         Aortic Arch:      Appears normal
 Fetal Cavum:      Appears normal         Ductal Arch:      Appears normal
 Ventricles:       Appears normal         Diaphragm:        Appears normal
 Choroid Plexus:   Appears normal         Stomach:          Appears normal, left
                                                            sided
 Cerebellum:       Appears normal         Abdomen:          Appears normal
 Posterior Fossa:  Appears normal         Abdominal Wall:   Appears nml (cord
                                                            insert, abd wall)
 Nuchal Fold:      Not applicable (>20    Cord Vessels:     Appears normal (3
                   wks GA)                                  vessel cord)
 Face:             Orbits nl; profile     Kidneys:          Appear normal
                   previously visualized
 Lips:             Appears normal         Bladder:          Appears normal
 Heart:            Appears normal         Spine:            Previously seen
                   (4CH, axis, and
                   situs)
 RVOT:             Appears normal         Lower             Previously seen
                                          Extremities:
 LVOT:             Appears normal         Upper             Previously seen
                                          Extremities:

 Other:  Heels and 5th digit previously visualized. Fetus appears to be a
         female. 1.5 x 1.5 x 2.5 cm left thoracic mass.
Cervix Uterus Adnexa

 Cervical Length:    3.3      cm

 Cervix:       Normal appearance by transabdominal scan.
 Uterus:       No abnormality visualized.
 Cul De Sac:   No free fluid seen.

 Left Ovary:    Not visualized.
 Right Ovary:   Not visualized.
 Adnexa:     No adnexal mass visualized.
Impression

 SIUP at 25+0 weeks
 Left-sided CPAM lesion (vs BPS); no mediastial shift; mass
 stable in size; CVR 0.13 - very low risk for hydrops
 All other interval fetal anatomy was seen and appeared
 normal
 Normal amniotic fluid volume
 Appropriate interval growth with EFW at the 75th %tile
Recommendations

 Hydrops check in 2 weeks
 Growth in 4 weeks
 Peds surgery consult appt to be made for [DATE]
